# Patient Record
Sex: Female | Born: 1940 | Race: White | Hispanic: No | Smoking: Never smoker
Health system: Southern US, Community
[De-identification: ages and names within clinical notes are randomized; demographics above are authoritative.]

## PROBLEM LIST (undated history)

## (undated) DIAGNOSIS — I10 Essential (primary) hypertension: Secondary | ICD-10-CM

## (undated) DIAGNOSIS — K219 Gastro-esophageal reflux disease without esophagitis: Secondary | ICD-10-CM

## (undated) DIAGNOSIS — M810 Age-related osteoporosis without current pathological fracture: Secondary | ICD-10-CM

## (undated) HISTORY — PX: CARPAL TUNNEL RELEASE: SHX101

---

## 2019-05-18 ENCOUNTER — Emergency Department (HOSPITAL_BASED_OUTPATIENT_CLINIC_OR_DEPARTMENT_OTHER)
Admission: EM | Admit: 2019-05-18 | Discharge: 2019-05-18 | Disposition: A | Discharge status: Home / Self Care | Payer: Medicare Other | Attending: Emergency Medicine | Admitting: Emergency Medicine

## 2019-05-18 ENCOUNTER — Encounter (HOSPITAL_BASED_OUTPATIENT_CLINIC_OR_DEPARTMENT_OTHER): Payer: Self-pay

## 2019-05-18 ENCOUNTER — Other Ambulatory Visit: Payer: Self-pay

## 2019-05-18 ENCOUNTER — Emergency Department (HOSPITAL_BASED_OUTPATIENT_CLINIC_OR_DEPARTMENT_OTHER): Payer: Medicare Other

## 2019-05-18 DIAGNOSIS — I10 Essential (primary) hypertension: Secondary | ICD-10-CM | POA: Diagnosis not present

## 2019-05-18 DIAGNOSIS — U071 COVID-19: Secondary | ICD-10-CM | POA: Diagnosis present

## 2019-05-18 HISTORY — DX: Age-related osteoporosis without current pathological fracture: M81.0

## 2019-05-18 HISTORY — DX: Essential (primary) hypertension: I10

## 2019-05-18 HISTORY — DX: Gastro-esophageal reflux disease without esophagitis: K21.9

## 2019-05-18 LAB — CBC WITH DIFFERENTIAL/PLATELET
Abs Immature Granulocytes: 0.01 10*3/uL (ref 0.00–0.07)
Basophils Absolute: 0 10*3/uL (ref 0.0–0.1)
Basophils Relative: 0 %
Eosinophils Absolute: 0 10*3/uL (ref 0.0–0.5)
Eosinophils Relative: 1 %
HCT: 36.1 % (ref 36.0–46.0)
Hemoglobin: 11.8 g/dL — ABNORMAL LOW (ref 12.0–15.0)
Immature Granulocytes: 0 %
Lymphocytes Relative: 39 %
Lymphs Abs: 1.7 10*3/uL (ref 0.7–4.0)
MCH: 30.5 pg (ref 26.0–34.0)
MCHC: 32.7 g/dL (ref 30.0–36.0)
MCV: 93.3 fL (ref 80.0–100.0)
Monocytes Absolute: 0.5 10*3/uL (ref 0.1–1.0)
Monocytes Relative: 11 %
Neutro Abs: 2.1 10*3/uL (ref 1.7–7.7)
Neutrophils Relative %: 49 %
Platelets: 318 10*3/uL (ref 150–400)
RBC: 3.87 MIL/uL (ref 3.87–5.11)
RDW: 13.5 % (ref 11.5–15.5)
WBC: 4.2 10*3/uL (ref 4.0–10.5)
nRBC: 0 % (ref 0.0–0.2)

## 2019-05-18 LAB — COMPREHENSIVE METABOLIC PANEL
ALT: 13 U/L (ref 0–44)
AST: 22 U/L (ref 15–41)
Albumin: 4.1 g/dL (ref 3.5–5.0)
Alkaline Phosphatase: 49 U/L (ref 38–126)
Anion gap: 7 (ref 5–15)
BUN: 13 mg/dL (ref 8–23)
CO2: 24 mmol/L (ref 22–32)
Calcium: 8.8 mg/dL — ABNORMAL LOW (ref 8.9–10.3)
Chloride: 99 mmol/L (ref 98–111)
Creatinine, Ser: 0.64 mg/dL (ref 0.44–1.00)
GFR calc Af Amer: >60 mL/min (ref >60)
GFR calc non Af Amer: >60 mL/min (ref >60)
Glucose, Bld: 134 mg/dL — ABNORMAL HIGH (ref 70–99)
Potassium: 3.9 mmol/L (ref 3.5–5.1)
Sodium: 130 mmol/L — ABNORMAL LOW (ref 135–145)
Total Bilirubin: 0.3 mg/dL (ref 0.3–1.2)
Total Protein: 7.5 g/dL (ref 6.5–8.1)

## 2019-05-18 LAB — TROPONIN I (HIGH SENSITIVITY): Troponin I (High Sensitivity): 3 ng/L (ref <18)

## 2019-05-18 NOTE — Discharge Instructions (Addendum)
Your symptoms are consistent with known COVID 19 infection.  Your oxygen levels are normal in the Emergency Department, your chest xray and heart numbers are normal, and your other labs do not show any other significant abnormalities.  Continue to quarantine, rest, stay hydrated and monitor your symptoms.  If you have worsening shortness of breath, fatigue or other concerns, return to the ED.

## 2019-05-18 NOTE — ED Notes (Addendum)
Ambulated in  room, no sob. Pulse ox stayed 97-98% HR in the 80-'s. Denies chest pain or sob.

## 2019-05-18 NOTE — ED Triage Notes (Signed)
Pt c/o chest tightness x 2 days and fatigue x 4 days-pt with +covid test yesterday-NAD-steady gait

## 2019-05-18 NOTE — ED Provider Notes (Signed)
[facility] EMERGENCY DEPARTMENT Provider Note   CSN: [id number] Arrival date & time: 05/18/19  1500     History Chief Complaint  Patient presents with  . Chest Pain  . +Covid    Alicia Sherman is a 78 y.o. female.  HPI      Monday felt sick, Tuesday felt better but began to feel ill again Tested positive for COVID yesterday Brother tested positive Last night began to have nausea, chest tightness, severe fatigue, some shortness of breath with walking. Just feeling very weak.  Chest tightness comes and goes.  Head seemed more congested last night, better this afternoon after decongestant and tylenol.  Monday had fever, but has been taking tylenol. Monday was 101.5.  Mild cough. No sore throat. Some loose stool but not unusual.   No leg pain or swelling.  Headache mild.  Low appetite, but has been making self eat and stay hydrated.   Past Medical History:  Diagnosis Date  . GERD (gastroesophageal reflux disease)   . Hypertension   . Osteoporosis     There are no problems to display for this patient.   Past Surgical History:  Procedure Laterality Date  . CARPAL TUNNEL RELEASE       OB History   No obstetric history on file.     No family history on file.  Social History   Tobacco Use  . Smoking status: Never Smoker  . Smokeless tobacco: Never Used  Substance Use Topics  . Alcohol use: Never  . Drug use: Never    Home Medications Prior to Admission medications   Not on File    Allergies    Biaxin [clarithromycin] and Codeine  Review of Systems   Review of Systems  Constitutional: Positive for appetite change and fatigue. Negative for fever (had monday now resolved).  HENT: Positive for congestion.   Respiratory: Positive for cough, chest tightness and shortness of breath.   Gastrointestinal: Positive for diarrhea and nausea. Negative for abdominal pain and vomiting.  Musculoskeletal: Negative for back pain.  Skin: Negative for rash.    Neurological: Positive for headaches.    Physical Exam Updated Vital Signs BP (!) 160/82 (BP Location: Right Arm)   Pulse 70   Temp 98.2 F (36.8 C) (Oral)   Resp 18   Ht 5\' 2"  (1.575 m)   Wt 57.6 kg   SpO2 99%   BMI 23.23 kg/m   Physical Exam Vitals and nursing note reviewed.  Constitutional:      General: She is not in acute distress.    Appearance: She is well-developed. She is not diaphoretic.  HENT:     Head: Normocephalic and atraumatic.  Eyes:     Conjunctiva/sclera: Conjunctivae normal.  Cardiovascular:     Rate and Rhythm: Normal rate and regular rhythm.     Heart sounds: Normal heart sounds. No murmur. No friction rub. No gallop.   Pulmonary:     Effort: Pulmonary effort is normal. No respiratory distress.     Breath sounds: Normal breath sounds. No wheezing or rales.  Abdominal:     General: There is no distension.     Palpations: Abdomen is soft.     Tenderness: There is no abdominal tenderness. There is no guarding.  Musculoskeletal:        General: No tenderness.     Cervical back: Normal range of motion.  Skin:    General: Skin is warm and dry.     Findings: No erythema or  rash.  Neurological:     Mental Status: She is alert and oriented to person, place, and time.     ED Results / Procedures / Treatments   Labs (all labs ordered are listed, but only abnormal results are displayed) Labs Reviewed  CBC WITH DIFFERENTIAL/PLATELET - Abnormal; Notable for the following components:      Result Value   Hemoglobin 11.8 (*)    All other components within normal limits  COMPREHENSIVE METABOLIC PANEL - Abnormal; Notable for the following components:   Sodium 130 (*)    Glucose, Bld 134 (*)    Calcium 8.8 (*)    All other components within normal limits  TROPONIN I (HIGH SENSITIVITY)  TROPONIN I (HIGH SENSITIVITY)    EKG EKG Interpretation  Date/Time:  Thursday May 18 2019 15:12:35 EST Ventricular Rate:  70 PR Interval:  172 QRS  Duration: 76 QT Interval:  394 QTC Calculation: 425 R Axis:   78 Text Interpretation: Normal sinus rhythm Anterior infarct , age undetermined Abnormal ECG No previous ECGs available Confirmed by Alvira Monday ([id number]) on 05/18/2019 3:23:47 PM   Radiology DG Chest Portable 1 View  Result Date: 05/18/2019 CLINICAL DATA:  Chest pain and tightness for several days, history of COVID-19 positivity EXAM: PORTABLE CHEST 1 VIEW COMPARISON:  None. FINDINGS: The heart size and mediastinal contours are within normal limits. Both lungs are clear. The visualized skeletal structures are unremarkable. IMPRESSION: No active disease. Electronically Signed   By: Alcide Clever M.D.   On: 05/18/2019 15:52    Procedures Procedures (including critical care time)  Medications Ordered in ED Medications - No data to display  ED Course  I have reviewed the triage vital signs and the nursing notes.  Pertinent labs & imaging results that were available during my care of the patient were reviewed by me and considered in my medical decision making (see chart for details).    MDM Rules/Calculators/A&P                      78yo female with history of hypertension and recent positive COVID 19 testing presents with concern for nausea, severe fatigue, chest tightness, nausea, generalized weakness.  EKG without acute findings. Troponin negative after continuing symptoms and doubt ACS. Labs without other significant acute changes.  Chest XR without pneumonia, edema or pneumothorax.  Suspect symptoms secondary to COVID 19 infection. Do not appreciate asymmetric leg swelling, low suspicion for PE at this time.  Normal oxygen saturations at rest and also with ambulation.  Discussed recommendation for continued supportive care, monitoring of symptoms.  Patient discharged in stable condition with understanding of reasons to return.    Final Clinical Impression(s) / ED Diagnoses Final diagnoses:  COVID-19    Rx / DC  Orders ED Discharge Orders    None       Alvira Monday, MD 05/19/19 1319

## 2021-03-08 IMAGING — DX DG CHEST 1V PORT
1 series · 1 of 1 positions shown · non-contrast
Comparison: None.

CLINICAL DATA: Chest pain and tightness for several days, history
of [id number] positivity

EXAM:
PORTABLE CHEST 1 VIEW

[chest ap]
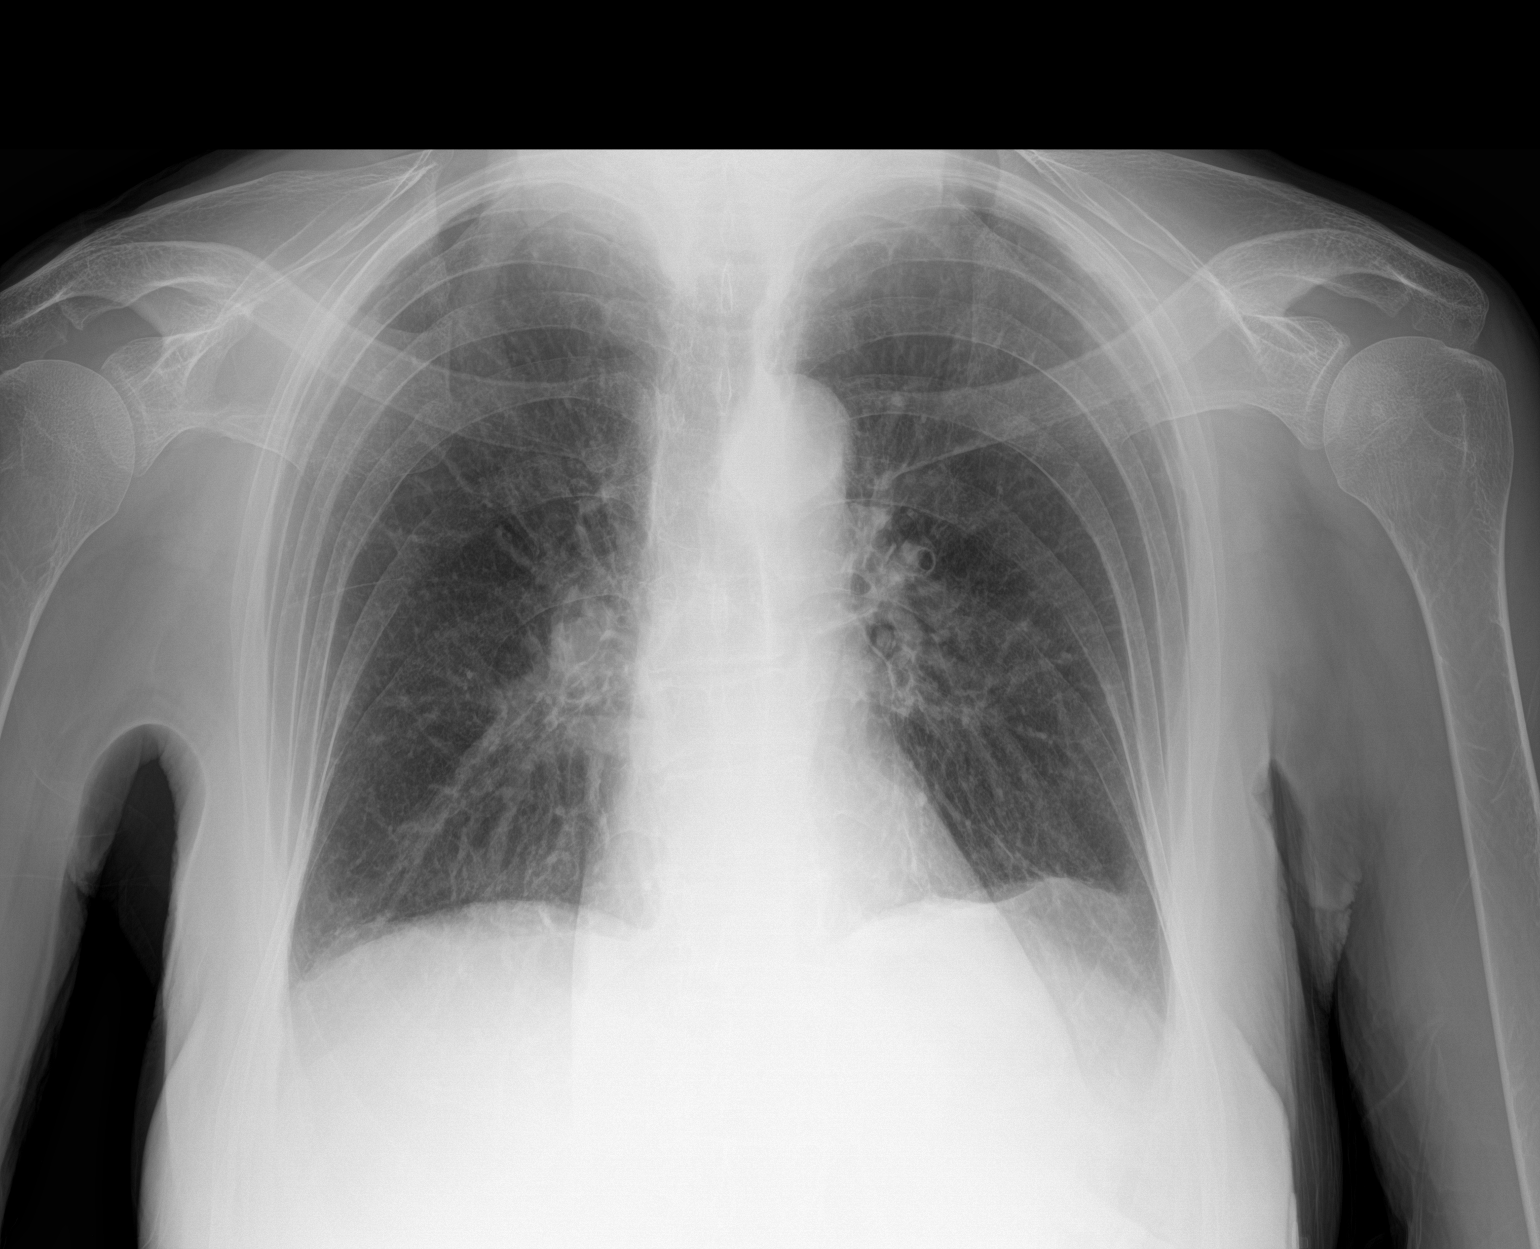

[1 of 1 positions shown; findings below may reference images not displayed]

FINDINGS: The heart size and mediastinal contours are within normal limits.
Both lungs are clear. The visualized skeletal structures are
unremarkable.
IMPRESSION: No active disease.

## 2022-06-10 ENCOUNTER — Ambulatory Visit (INDEPENDENT_AMBULATORY_CARE_PROVIDER_SITE_OTHER): Payer: Medicare Other | Admitting: Neurology

## 2022-06-10 ENCOUNTER — Encounter: Payer: Self-pay | Admitting: Neurology

## 2022-06-10 VITALS — BP 153/84 | HR 67 | Ht 62.0 in | Wt 128.5 lb

## 2022-06-10 DIAGNOSIS — G3184 Mild cognitive impairment, so stated: Secondary | ICD-10-CM

## 2022-06-10 DIAGNOSIS — G466 Pure sensory lacunar syndrome: Secondary | ICD-10-CM | POA: Diagnosis not present

## 2022-06-10 DIAGNOSIS — G459 Transient cerebral ischemic attack, unspecified: Secondary | ICD-10-CM

## 2022-06-10 DIAGNOSIS — R202 Paresthesia of skin: Secondary | ICD-10-CM | POA: Diagnosis not present

## 2022-06-10 MED ORDER — CLOPIDOGREL BISULFATE 75 MG PO TABS: 75.0000 mg | ORAL_TABLET | Freq: Every day | ORAL | 0 refills | Status: AC

## 2022-06-10 NOTE — Patient Instructions (Signed)
I had a long d/w patient about her  recent episodes of cheiro oral paresthesias, risk for recurrent stroke/TIAs, personally independently reviewed imaging studies and stroke evaluation results and answered questions.I recommend she add Plavix 75 mg daily for 3 weeks continue aspirin 81 mg daily long-term   for secondary stroke prevention and maintain strict control of hypertension with blood pressure goal below 130/90, diabetes with hemoglobin A1c goal below 6.5% and lipids with LDL cholesterol goal below 70 mg/dL. I also advised the patient to eat a healthy diet with plenty of whole grains, cereals, fruits and vegetables, exercise regularly and maintain ideal body weight .We discussed memory compensation strategies for mild cognitive impairment.  Followup in the future with me in 3 months or call earlier if necessary  Memory Compensation Strategies  Use "WARM" strategy.  W= write it down  A= associate it  R= repeat it  M= make a mental note  2.   You can keep a Glass blower/designer.  Use a 3-ring notebook with sections for the following: calendar, important names and phone numbers,  medications, doctors' names/phone numbers, lists/reminders, and a section to journal what you did  each day.   3.    Use a calendar to write appointments down.  4.    Write yourself a schedule for the day.  This can be placed on the calendar or in a separate section of the Memory Notebook.  Keeping a  regular schedule can help memory.  5.    Use medication organizer with sections for each day or morning/evening pills.  You may need help loading it  6.    Keep a basket, or pegboard by the door.  Place items that you need to take out with you in the basket or on the pegboard.  You may also want to  include a message board for reminders.  7.    Use sticky notes.  Place sticky notes with reminders in a place where the task is performed.  For example: " turn off the  stove" placed by the stove, "lock the door" placed on  the door at eye level, " take your medications" on  the bathroom mirror or by the place where you normally take your medications.  8.    Use alarms/timers.  Use while cooking to remind yourself to check on food or as a reminder to take your medicine, or as a  reminder to make a call, or as a reminder to perform another task, etc.  Stroke Prevention Some medical conditions and behaviors can lead to a higher chance of having a stroke. You can help prevent a stroke by eating healthy, exercising, not smoking, and managing any medical conditions you have. Stroke is a leading cause of functional impairment. Primary prevention is particularly important because a majority of strokes are first-time events. Stroke changes the lives of not only those who experience a stroke but also their family and other caregivers. How can this condition affect me? A stroke is a medical emergency and should be treated right away. A stroke can lead to brain damage and can sometimes be life-threatening. If a person gets medical treatment right away, there is a better chance of surviving and recovering from a stroke. What can increase my risk? The following medical conditions may increase your risk of a stroke: Cardiovascular disease. High blood pressure (hypertension). Diabetes. High cholesterol. Sickle cell disease. Blood clotting disorders (hypercoagulable state). Obesity. Sleep disorders (obstructive sleep apnea). Other risk factors include: Being older  than age 56. Having a history of blood clots, stroke, or mini-stroke (transient ischemic attack, TIA). Genetic factors, such as race, ethnicity, or a family history of stroke. Smoking cigarettes or using other tobacco products. Taking birth control pills, especially if you also use tobacco. Heavy use of alcohol or drugs, especially cocaine and methamphetamine. Physical inactivity. What actions can I take to prevent this? Manage your health conditions High  cholesterol levels. Eating a healthy diet is important for preventing high cholesterol. If cholesterol cannot be managed through diet alone, you may need to take medicines. Take any prescribed medicines to control your cholesterol as told by your health care provider. Hypertension. To reduce your risk of stroke, try to keep your blood pressure below 130/80. Eating a healthy diet and exercising regularly are important for controlling blood pressure. If these steps are not enough to manage your blood pressure, you may need to take medicines. Take any prescribed medicines to control hypertension as told by your health care provider. Ask your health care provider if you should monitor your blood pressure at home. Have your blood pressure checked every year, even if your blood pressure is normal. Blood pressure increases with age and some medical conditions. Diabetes. Eating a healthy diet and exercising regularly are important parts of managing your blood sugar (glucose). If your blood sugar cannot be managed through diet and exercise, you may need to take medicines. Take any prescribed medicines to control your diabetes as told by your health care provider. Get evaluated for obstructive sleep apnea. Talk to your health care provider about getting a sleep evaluation if you snore a lot or have excessive sleepiness. Make sure that any other medical conditions you have, such as atrial fibrillation or atherosclerosis, are managed. Nutrition Follow instructions from your health care provider about what to eat or drink to help manage your health condition. These instructions may include: Reducing your daily calorie intake. Limiting how much salt (sodium) you use to 1,500 milligrams (mg) each day. Using only healthy fats for cooking, such as olive oil, canola oil, or sunflower oil. Eating healthy foods. You can do this by: Choosing foods that are high in fiber, such as whole grains, and fresh fruits and  vegetables. Eating at least 5 servings of fruits and vegetables a day. Try to fill one-half of your plate with fruits and vegetables at each meal. Choosing lean protein foods, such as lean cuts of meat, poultry without skin, fish, tofu, beans, and nuts. Eating low-fat dairy products. Avoiding foods that are high in sodium. This can help lower blood pressure. Avoiding foods that have saturated fat, trans fat, and cholesterol. This can help prevent high cholesterol. Avoiding processed and prepared foods. Counting your daily carbohydrate intake.  Lifestyle If you drink alcohol: Limit how much you have to: 0-1 drink a day for women who are not pregnant. 0-2 drinks a day for men. Know how much alcohol is in your drink. In the [place] one drink equals one 12 oz bottle of beer (375mL), one 5 oz glass of wine (152mL), or one 1 oz glass of hard liquor (23mL). Do not use any products that contain nicotine or tobacco. These products include cigarettes, chewing tobacco, and vaping devices, such as e-cigarettes. If you need help quitting, ask your health care provider. Avoid secondhand smoke. Do not use drugs. Activity  Try to stay at a healthy weight. Get at least 30 minutes of exercise on most days, such as: Fast walking. Biking. Swimming. Medicines Take over-the-counter  and prescription medicines only as told by your health care provider. Aspirin or blood thinners (antiplatelets or anticoagulants) may be recommended to reduce your risk of forming blood clots that can lead to stroke. Avoid taking birth control pills. Talk to your health care provider about the risks of taking birth control pills if: You are over 69 years old. You smoke. You get very bad headaches. You have had a blood clot. Where to find more information American Stroke Association: www.strokeassociation.org Get help right away if: You or a loved one has any symptoms of a stroke. "BE FAST" is an easy way to remember the main  warning signs of a stroke: B - Balance. Signs are dizziness, sudden trouble walking, or loss of balance. E - Eyes. Signs are trouble seeing or a sudden change in vision. F - Face. Signs are sudden weakness or numbness of the face, or the face or eyelid drooping on one side. A - Arms. Signs are weakness or numbness in an arm. This happens suddenly and usually on one side of the body. S - Speech. Signs are sudden trouble speaking, slurred speech, or trouble understanding what people say. T - Time. Time to call emergency services. Write down what time symptoms started. You or a loved one has other signs of a stroke, such as: A sudden, severe headache with no known cause. Nausea or vomiting. Seizure. These symptoms may represent a serious problem that is an emergency. Do not wait to see if the symptoms will go away. Get medical help right away. Call your local emergency services (911 in the [place] Do not drive yourself to the hospital. Summary You can help to prevent a stroke by eating healthy, exercising, not smoking, limiting alcohol intake, and managing any medical conditions you may have. Do not use any products that contain nicotine or tobacco. These include cigarettes, chewing tobacco, and vaping devices, such as e-cigarettes. If you need help quitting, ask your health care provider. Remember "BE FAST" for warning signs of a stroke. Get help right away if you or a loved one has any of these signs. This information is not intended to replace advice given to you by your health care provider. Make sure you discuss any questions you have with your health care provider. Document Revised: 12/04/2019 Document Reviewed: 12/04/2019 Elsevier Patient Education  [place].

## 2022-06-10 NOTE — Progress Notes (Signed)
[facility] [address] [place]. [place] [address] [phone]       OFFICE CONSULT NOTE  [name] Date of Birth:  08-Mar-1941 Medical Record Number:  [id number]   Referring MD:  Teressa Lower  Reason for Referral:  TIA  HPI: Alicia Sherman is a pleasant 82 year old Caucasian lady seen today for initial office consultation visit.  She is accompanied today by her niece Elizebeth Brooking, NP.  History is obtained from them and review of electronic medical records and care everywhere.  No actual imaging films are available for review but reports were available.  He has past medical history of hypertension, osteoporosis and gastroesophageal reflux disease.  He was recently admitted to [facility] on 05/31/2022 for sudden onset of left face and and feet paresthesias which she describes as a sensation of tingling and lasted for several hours and improved on this the next day.  She denies any accompanying slurred speech, extremity weakness, gait or balance problems. Her blood pressure was found to be elevated with systolics in the 741U.  By the time EMS arrived with came down somewhat to 160s.  He was taken to the hospital where she was hospitalized.  CT head was unremarkable.  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  and changes of chronic small vessel disease.  CT angiogram of the brain and neck showed no large vessel stenosis but showed only minor abnormalities.  Transthoracic echo showed ejection fraction of 60 to 65%.  Left atrial size is normal.  LDL cholesterol was 97 mg percent and hemoglobin A1c 5.8.  Serum sodium was 124 but she has known chronic hyponatremia.  Patient states she had somewhat similar episode in September 2023 and she was seen briefly in [facility] emergency room at that time.  He was told that her blood pressure was elevated and that was responsible for her symptoms.  Since discharge patient states about a  week or 2 ago she also had a milder episode of tingling on the left side.  She has been started on aspirin 81 mg daily which she is taking and tolerating well without side effects.  Her blood pressure is doing better though today it is slightly elevated in office at 153/83.  ROS:   14 system review of systems is positive for tingling, numbness and all other systems negative  PMH:  Past Medical History:  Diagnosis Date   GERD (gastroesophageal reflux disease)    Hypertension    Osteoporosis     Social History:  Social History   Socioeconomic History   Marital status: Widowed    Spouse name: Not on file   Number of children: Not on file   Years of education: Not on file   Highest education level: Not on file  Occupational History   Not on file  Tobacco Use   Smoking status: Never   Smokeless tobacco: Never  Vaping Use   Vaping Use: Never used  Substance and Sexual Activity   Alcohol use: Never   Drug use: Never   Sexual activity: Not on file  Other Topics Concern   Not on file  Social History Narrative   Not on file   Social Determinants of Health   Financial Resource Strain: Not on file  Food Insecurity: Not on file  Transportation Needs: Not on file  Physical Activity: Not on file  Stress: Not on file  Social Connections: Not on file  Intimate Partner Violence:  Not on file    Medications:   Current Outpatient Medications on File Prior to Visit  Medication Sig Dispense Refill   aspirin EC 81 MG tablet Take 81 mg by mouth daily.     atorvastatin (LIPITOR) 40 MG tablet Take 40 mg by mouth daily.     carvedilol (COREG) 6.25 MG tablet Take 6.25 mg by mouth 2 (two) times daily with a meal.     valsartan (DIOVAN) 320 MG tablet Take 160 mg by mouth daily.     Calcium Carb-Cholecalciferol 600-12.5 MG-MCG CAPS Take 1 capsule by mouth 2 (two) times daily.     Probiotic Product (ADVANCED PROBIOTIC 10) CAPS Take 1 capsule by mouth daily.     No current  facility-administered medications on file prior to visit.    Allergies:   Allergies  Allergen Reactions   Biaxin [Clarithromycin] Nausea And Vomiting   Codeine Nausea And Vomiting    Physical Exam General: well developed, well nourished pleasant elderly Caucasian lady, seated, in no evident distress Head: head normocephalic and atraumatic.   Neck: supple with no carotid or supraclavicular bruits Cardiovascular: regular rate and rhythm, no murmurs Musculoskeletal: no deformity Skin:  no rash/petichiae Vascular:  Normal pulses all extremities  Neurologic Exam Mental Status: Awake and fully alert. Oriented to place and time. Recent and remote memory intact. Attention span, concentration and fund of knowledge appropriate. Mood and affect appropriate.  Diminished recall 1/3.  Able to name 12 animals which can walk on 4 legs. Cranial Nerves: Fundoscopic exam reveals sharp disc margins. Pupils equal, briskly reactive to light. Extraocular movements full without nystagmus. Visual fields full to confrontation. Hearing intact. Facial sensation intact. Face, tongue, palate moves normally and symmetrically.  Motor: Normal bulk and tone. Normal strength in all tested extremity muscles. Sensory.: intact to touch , pinprick , position and vibratory sensation.  Coordination: Rapid alternating movements normal in all extremities. Finger-to-nose and heel-to-shin performed accurately bilaterally. Gait and Station: Arises from chair without difficulty. Stance is normal. Gait demonstrates normal stride length and balance . Able to heel, toe and tandem walk with moderate difficulty.  Reflexes: 1+ and symmetric. Toes downgoing.   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  Vascular risk factors of hypertension and hyperlipidemia and age only.  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     PLAN: I had a long d/w patient about her  recent episodes of cheiro oral paresthesias, risk for recurrent stroke/TIAs, personally independently reviewed imaging studies and stroke evaluation results and answered questions.I recommend she add Plavix 75 mg daily for 3 weeks continue aspirin 81 mg daily long-term   for secondary stroke prevention and maintain strict control of hypertension with blood pressure goal below 130/90, diabetes with hemoglobin A1c goal below 6.5% and lipids with LDL cholesterol goal below 70 mg/dL. I also advised the patient to eat a healthy diet with plenty of whole grains, cereals, fruits and vegetables, exercise regularly and maintain ideal body weight .We discussed memory compensation strategies for mild cognitive impairment.  Followup in the future with me in 3 months or call earlier if necessary.  Greater than 50% time during this 45-minute consultation visit were spent on counseling and coordination of care about episodes of paresthesias and TIA and answering questions.  Antony Contras, MD Note: This document was prepared with digital dictation  and possible smart Company secretary. Any transcriptional errors that result from this process are unintentional.

## 2022-09-15 ENCOUNTER — Ambulatory Visit (INDEPENDENT_AMBULATORY_CARE_PROVIDER_SITE_OTHER): Payer: Medicare Other | Admitting: Neurology

## 2022-09-15 ENCOUNTER — Encounter: Payer: Self-pay | Admitting: Neurology

## 2022-09-15 VITALS — BP 165/89 | HR 61 | Ht 63.0 in | Wt 127.0 lb

## 2022-09-15 DIAGNOSIS — G3184 Mild cognitive impairment, so stated: Secondary | ICD-10-CM

## 2022-09-15 DIAGNOSIS — Z8673 Personal history of transient ischemic attack (TIA), and cerebral infarction without residual deficits: Secondary | ICD-10-CM | POA: Diagnosis not present

## 2022-09-15 NOTE — Patient Instructions (Addendum)
I had a long d/w patient and her daughter about her  recent episodes of TIA, risk for recurrent stroke/TIAs, personally independently reviewed imaging studies and stroke evaluation results and answered questions.I recommend she  continue aspirin 81 mg daily long-term   for secondary stroke prevention and maintain strict control of hypertension with blood pressure goal below 130/90, diabetes with hemoglobin A1c goal below 6.5% and lipids with LDL cholesterol goal below 70 mg/dL. I also advised the patient to eat a healthy diet with plenty of whole grains, cereals, fruits and vegetables, exercise regularly and maintain ideal body weight .We discussed memory compensation strategies for mild cognitive impairment and I advised her to increase participation in cognitively challenging activities like solving crossword puzzles, playing bridge, word searches and duple..  Followup in the future with me in 1 year or call earlier if necessary.    Memory Compensation Strategies  Use "WARM" strategy.  W= write it down  A= associate it  R= repeat it  M= make a mental note  2.   You can keep a Glass blower/designer.  Use a 3-ring notebook with sections for the following: calendar, important names and phone numbers,  medications, doctors' names/phone numbers, lists/reminders, and a section to journal what you did  each day.   3.    Use a calendar to write appointments down.  4.    Write yourself a schedule for the day.  This can be placed on the calendar or in a separate section of the Memory Notebook.  Keeping a  regular schedule can help memory.  5.    Use medication organizer with sections for each day or morning/evening pills.  You may need help loading it  6.    Keep a basket, or pegboard by the door.  Place items that you need to take out with you in the basket or on the pegboard.  You may also want to  include a message board for reminders.  7.    Use sticky notes.  Place sticky notes with reminders in a  place where the task is performed.  For example: " turn off the  stove" placed by the stove, "lock the door" placed on the door at eye level, " take your medications" on  the bathroom mirror or by the place where you normally take your medications.  8.    Use alarms/timers.  Use while cooking to remind yourself to check on food or as a reminder to take your medicine, or as a  reminder to make a call, or as a reminder to perform another task, etc.

## 2022-09-15 NOTE — Progress Notes (Signed)
[facility] [address] [place] [place] [address] [phone]       OFFICE follow-up visit T NOTE  [name] Date of Birth:  1941/02/17 Medical Record Number:  [id number]   Referring MD:  Alicia Sherman  Reason for Referral:  TIA  HPI: Initial visit 06/06/2022 Alicia Sherman is a pleasant 82 year old Caucasian lady seen today for initial office consultation visit.  She is accompanied today by her niece Alicia Cooper, NP.  History is obtained from them and review of electronic medical records and care everywhere.  No actual imaging films are available for review but reports were available.  He has past medical history of hypertension, osteoporosis and gastroesophageal reflux disease.  He was recently admitted to [facility] on 05/31/2022 for sudden onset of left face and and feet paresthesias which she describes as a sensation of tingling and lasted for several hours and improved on this the next day.  She denies any accompanying slurred speech, extremity weakness, gait or balance problems. Her blood pressure was found to be elevated with systolics in the 190s.  By the time EMS arrived with came down somewhat to 160s.  He was taken to the hospital where she was hospitalized.  CT head was unremarkable.  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  and changes of chronic small vessel disease.  CT angiogram of the brain and neck showed no large vessel stenosis but showed only minor abnormalities.  Transthoracic echo showed ejection fraction of 60 to 65%.  Left atrial size is normal.  LDL cholesterol was 97 mg percent and hemoglobin A1c 5.8.  Serum sodium was 124 but she has known chronic hyponatremia.  Patient states she had somewhat similar episode in September 2023 and she was seen briefly in [facility] emergency room at that time.  He was told that her blood pressure was elevated and that was responsible for her symptoms.  Since  discharge patient states about a week or 2 ago she also had a milder episode of tingling on the left side.  She has been started on aspirin 81 mg daily which she is taking and tolerating well without side effects.  Her blood pressure is doing better though today it is slightly elevated in office at 153/83. Update 09/15/2022 : She returns for follow-up after last visit 3 months ago.  She is doing well.  She has had no further episodes of paresthesias or any other stroke or TIA-like symptoms.  He no longer notes any dizziness or any sensation in the left leg.  She remains on aspirin which is tolerating well without bruising or bleeding.  She states her blood pressure is well-controlled at home and she brought her blood pressure log which shows average systolic ranging from 110-130s.  Today her blood pressure is slightly elevated in the office at 160/80.  She is tolerating Lipitor 40 mg well without muscle aches and pains.  Last lipid profile on 08/31/2022 showed total cholesterol 121, LDL 53, HDL 55 and triglycerides 54 mg percent.  Hemoglobin A1c on 07/02/2022 was optimal at 5.8.  He continues to have mild short-term memory difficulties but she feels these are stable and nonprogressive.  He is still quite independent with all activities of daily living.  He has however not been doing regular activities challenging.  He is still quite physically active.  She has no new complaints. ROS:   14 system review of systems is positive for tingling, numbness  and all other systems negative  PMH:  Past Medical History:  Diagnosis Date   GERD (gastroesophageal reflux disease)    Hypertension    Osteoporosis     Social History:  Social History   Socioeconomic History   Marital status: Widowed    Spouse name: Not on file   Number of children: Not on file   Years of education: Not on file   Highest education level: Not on file  Occupational History   Not on file  Tobacco Use   Smoking status: Never   Smokeless  tobacco: Never  Vaping Use   Vaping Use: Never used  Substance and Sexual Activity   Alcohol use: Never   Drug use: Never   Sexual activity: Not on file  Other Topics Concern   Not on file  Social History Narrative   Not on file   Social Determinants of Health   Financial Resource Strain: Not on file  Food Insecurity: Not on file  Transportation Needs: Not on file  Physical Activity: Not on file  Stress: Not on file  Social Connections: Not on file  Intimate Partner Violence: Not on file    Medications:   Current Outpatient Medications on File Prior to Visit  Medication Sig Dispense Refill   ASPIRIN 81 PO Take by mouth daily.     atorvastatin (LIPITOR) 40 MG tablet Take 40 mg by mouth daily.     Calcium Carb-Cholecalciferol 600-12.5 MG-MCG CAPS Take 1 capsule by mouth 2 (two) times daily.     carvedilol (COREG) 6.25 MG tablet Take 6.25 mg by mouth 2 (two) times daily with a meal.     Probiotic Product (ADVANCED PROBIOTIC 10) CAPS Take 1 capsule by mouth daily.     valsartan (DIOVAN) 320 MG tablet Take 160 mg by mouth daily.     No current facility-administered medications on file prior to visit.    Allergies:   Allergies  Allergen Reactions   Biaxin [Clarithromycin] Nausea And Vomiting   Codeine Nausea And Vomiting    Physical Exam General: well developed, well nourished pleasant elderly Caucasian lady, seated, in no evident distress Head: head normocephalic and atraumatic.   Neck: supple with no carotid or supraclavicular bruits Cardiovascular: regular rate and rhythm, no murmurs Musculoskeletal: no deformity Skin:  no rash/petichiae Vascular:  Normal pulses all extremities  Neurologic Exam Mental Status: Awake and fully alert. Oriented to place and time. Recent and remote memory intact. Attention span, concentration and fund of knowledge appropriate. Mood and affect appropriate.  Recall 3/3.  Able to name 13 animals which can walk on 4 legs.  Clock drawing  4/4 Cranial Nerves: Fundoscopic exam not done. Pupils equal, briskly reactive to light. Extraocular movements full without nystagmus. Visual fields full to confrontation. Hearing intact. Facial sensation intact. Face, tongue, palate moves normally and symmetrically.  Motor: Normal bulk and tone. Normal strength in all tested extremity muscles. Sensory.: intact to touch , pinprick , position and vibratory sensation.  Coordination: Rapid alternating movements normal in all extremities. Finger-to-nose and heel-to-shin performed accurately bilaterally. Gait and Station: Arises from chair without difficulty. Stance is normal. Gait demonstrates normal stride length and balance . Able to heel, toe and tandem walk with moderate difficulty.  Reflexes: 1+ and symmetric. Toes downgoing.      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  Vascular risk factors of hypertension and hyperlipidemia and age  only.  She has mild age-appropriate cognitive impairment and balance difficulties which appears stable     PLAN: I had a long d/w patient and her daughter about her  recent episodes of TIA, risk for recurrent stroke/TIAs, personally independently reviewed imaging studies and stroke evaluation results and answered questions.I recommend she  continue aspirin 81 mg daily long-term   for secondary stroke prevention and maintain strict control of hypertension with blood pressure goal below 130/90, diabetes with hemoglobin A1c goal below 6.5% and lipids with LDL cholesterol goal below 70 mg/dL. I also advised the patient to eat a healthy diet with plenty of whole grains, cereals, fruits and vegetables, exercise regularly and maintain ideal body weight .We discussed memory compensation strategies for mild cognitive impairment and I advised her to increase participation in cognitively challenging  activities like solving crossword puzzles, playing bridge, word searches and duple..  Followup in the future with me in 1 year or call earlier if necessary.  Greater than 50% time during this 35-minute   visit were spent on counseling and coordination of care about episodes of paresthesias and TIA and answering questions.  Delia Heady, MD Note: This document was prepared with digital dictation and possible smart phrase technology. Any transcriptional errors that result from this process are unintentional.

## 2023-02-15 ENCOUNTER — Telehealth: Payer: Self-pay

## 2023-02-15 ENCOUNTER — Emergency Department (HOSPITAL_COMMUNITY): Payer: Medicare Other

## 2023-02-15 ENCOUNTER — Encounter: Payer: Self-pay | Admitting: Neurology

## 2023-02-15 ENCOUNTER — Other Ambulatory Visit: Payer: Self-pay

## 2023-02-15 ENCOUNTER — Encounter (HOSPITAL_COMMUNITY): Payer: Self-pay

## 2023-02-15 ENCOUNTER — Emergency Department (HOSPITAL_COMMUNITY)
Admission: EM | Admit: 2023-02-15 | Discharge: 2023-02-16 | Disposition: A | Discharge status: Home / Self Care | Payer: Medicare Other | Attending: Emergency Medicine | Admitting: Emergency Medicine

## 2023-02-15 DIAGNOSIS — R200 Anesthesia of skin: Secondary | ICD-10-CM | POA: Diagnosis not present

## 2023-02-15 DIAGNOSIS — I1A0 Resistant hypertension: Secondary | ICD-10-CM | POA: Diagnosis not present

## 2023-02-15 DIAGNOSIS — I6782 Cerebral ischemia: Secondary | ICD-10-CM | POA: Insufficient documentation

## 2023-02-15 DIAGNOSIS — R202 Paresthesia of skin: Secondary | ICD-10-CM

## 2023-02-15 DIAGNOSIS — Z7982 Long term (current) use of aspirin: Secondary | ICD-10-CM | POA: Diagnosis not present

## 2023-02-15 DIAGNOSIS — I10 Essential (primary) hypertension: Secondary | ICD-10-CM | POA: Diagnosis not present

## 2023-02-15 DIAGNOSIS — H538 Other visual disturbances: Secondary | ICD-10-CM | POA: Insufficient documentation

## 2023-02-15 DIAGNOSIS — Z8673 Personal history of transient ischemic attack (TIA), and cerebral infarction without residual deficits: Secondary | ICD-10-CM | POA: Diagnosis not present

## 2023-02-15 LAB — DIFFERENTIAL
Abs Immature Granulocytes: 0.01 10*3/uL (ref 0.00–0.07)
Basophils Absolute: 0 10*3/uL (ref 0.0–0.1)
Basophils Relative: 1 %
Eosinophils Absolute: 0.2 10*3/uL (ref 0.0–0.5)
Eosinophils Relative: 3 %
Immature Granulocytes: 0 %
Lymphocytes Relative: 38 %
Lymphs Abs: 2.2 10*3/uL (ref 0.7–4.0)
Monocytes Absolute: 0.8 10*3/uL (ref 0.1–1.0)
Monocytes Relative: 14 %
Neutro Abs: 2.6 10*3/uL (ref 1.7–7.7)
Neutrophils Relative %: 44 %

## 2023-02-15 LAB — COMPREHENSIVE METABOLIC PANEL
ALT: 19 U/L (ref 0–44)
AST: 25 U/L (ref 15–41)
Albumin: 3.9 g/dL (ref 3.5–5.0)
Alkaline Phosphatase: 90 U/L (ref 38–126)
Anion gap: 11 (ref 5–15)
BUN: 15 mg/dL (ref 8–23)
CO2: 22 mmol/L (ref 22–32)
Calcium: 9 mg/dL (ref 8.9–10.3)
Chloride: 98 mmol/L (ref 98–111)
Creatinine, Ser: 1.3 mg/dL — ABNORMAL HIGH (ref 0.44–1.00)
GFR, Estimated: 42 mL/min — ABNORMAL LOW (ref >60)
Glucose, Bld: 108 mg/dL — ABNORMAL HIGH (ref 70–99)
Potassium: 4.7 mmol/L (ref 3.5–5.1)
Sodium: 131 mmol/L — ABNORMAL LOW (ref 135–145)
Total Bilirubin: 0.8 mg/dL (ref 0.3–1.2)
Total Protein: 7.2 g/dL (ref 6.5–8.1)

## 2023-02-15 LAB — CBC
HCT: 32.4 % — ABNORMAL LOW (ref 36.0–46.0)
Hemoglobin: 10.6 g/dL — ABNORMAL LOW (ref 12.0–15.0)
MCH: 30.8 pg (ref 26.0–34.0)
MCHC: 32.7 g/dL (ref 30.0–36.0)
MCV: 94.2 fL (ref 80.0–100.0)
Platelets: 278 10*3/uL (ref 150–400)
RBC: 3.44 MIL/uL — ABNORMAL LOW (ref 3.87–5.11)
RDW: 13.8 % (ref 11.5–15.5)
WBC: 5.9 10*3/uL (ref 4.0–10.5)
nRBC: 0 % (ref 0.0–0.2)

## 2023-02-15 LAB — I-STAT CHEM 8, ED
BUN: 17 mg/dL (ref 8–23)
Calcium, Ion: 1.05 mmol/L — ABNORMAL LOW (ref 1.15–1.40)
Chloride: 99 mmol/L (ref 98–111)
Creatinine, Ser: 1.4 mg/dL — ABNORMAL HIGH (ref 0.44–1.00)
Glucose, Bld: 110 mg/dL — ABNORMAL HIGH (ref 70–99)
HCT: 33 % — ABNORMAL LOW (ref 36.0–46.0)
Hemoglobin: 11.2 g/dL — ABNORMAL LOW (ref 12.0–15.0)
Potassium: 4.7 mmol/L (ref 3.5–5.1)
Sodium: 132 mmol/L — ABNORMAL LOW (ref 135–145)
TCO2: 21 mmol/L — ABNORMAL LOW (ref 22–32)

## 2023-02-15 LAB — PROTIME-INR
INR: 1.1 (ref 0.8–1.2)
Prothrombin Time: 14.3 s (ref 11.4–15.2)

## 2023-02-15 LAB — ETHANOL: Alcohol, Ethyl (B): <10 mg/dL (ref <10)

## 2023-02-15 LAB — APTT: aPTT: 31 s (ref 24–36)

## 2023-02-15 NOTE — ED Provider Triage Note (Signed)
Emergency Medicine Provider Triage Evaluation Note  Alicia Sherman , a 82 y.o. female  was evaluated in triage.  Pt complains of left vision loss, left-sided facial numbness.  Patient states that symptoms began this past Saturday with "not feeling well."  Patient states that she has been taking her blood pressure repeatedly since not feeling well and started to be elevated in the 170s to 190s systolic despite adherence with her hypertension medications.  Reports 3:00 yesterday afternoon having episode where she completely lost vision in her left eye but could only see "waves" in her vision.  States that symptoms of visual loss resolved and has some residual left-sided facial "funny feeling".  Patient states that she also is having some left leg numbness as well as left arm numbness that has been present since the weekend.  Review of Systems  Positive: See above Negative:   Physical Exam  BP (!) 176/96 (BP Location: Right Arm)   Pulse 83   Temp 98.6 F (37 C) (Oral)   Resp 18   Ht 5\' 3"  (1.6 m)   Wt 57.6 kg   SpO2 100%   BMI 22.49 kg/m  Gen:   Awake, no distress   Resp:  Normal effort  MSK:   Moves extremities without difficulty  Other:    Medical Decision Making  Medically screening exam initiated at 6:21 PM.  Appropriate orders placed.  Cintya Daughety was informed that the remainder of the evaluation will be completed by another provider, this initial triage assessment does not replace that evaluation, and the importance of remaining in the ED until their evaluation is complete.     Peter Garter, [place] 02/15/23 [phone]

## 2023-02-15 NOTE — ED Triage Notes (Signed)
Pt c/o blurred vision of left eye, left leg and foot numbness started yesterday around 1500. Pt c/o HTN Saturday night.

## 2023-02-15 NOTE — Telephone Encounter (Signed)
The patient sent a mychart message regarding elevated blood pressure and visual changes. I called the patient back and sent her a mychart message. She did not initially answer but returned my call shortly after. I had advised her previously via voicemail and mychart message to go to the emergency room.  Upon taking the call the patient described to me that she had elevated blood pressure readings this weekend (yesterday her blood pressure was 196/102). She took an additional half of carvedilol and her blood pressure fell to around 123/80.  She also described visual changes, such as lines in her vision. It had since dissipated but she was now having a numb feeling in her eye and a "different" feeling. Her blood pressure continued to fluctuate today. She says she is having frequent arm and leg numbness and pain in her jaw.   I advised the patient once again to go to the Emergency Room given her current symptoms and history of TIA and stroke. She verbalized understanding and stated that she would go to the ER.

## 2023-02-16 ENCOUNTER — Emergency Department (HOSPITAL_COMMUNITY): Payer: Medicare Other

## 2023-02-16 DIAGNOSIS — I1A0 Resistant hypertension: Secondary | ICD-10-CM | POA: Diagnosis not present

## 2023-02-16 LAB — RAPID URINE DRUG SCREEN, HOSP PERFORMED
Amphetamines: NOT DETECTED
Barbiturates: NOT DETECTED
Benzodiazepines: NOT DETECTED
Cocaine: NOT DETECTED
Opiates: NOT DETECTED
Tetrahydrocannabinol: NOT DETECTED

## 2023-02-16 LAB — URINALYSIS, ROUTINE W REFLEX MICROSCOPIC
Bilirubin Urine: NEGATIVE
Glucose, UA: NEGATIVE mg/dL
Ketones, ur: NEGATIVE mg/dL
Leukocytes,Ua: NEGATIVE
Nitrite: NEGATIVE
Protein, ur: NEGATIVE mg/dL
Specific Gravity, Urine: 1.004 — ABNORMAL LOW (ref 1.005–1.030)
pH: 7 (ref 5.0–8.0)

## 2023-02-16 MED ORDER — IRBESARTAN 300 MG PO TABS
150.0000 mg | ORAL_TABLET | Freq: Every day | ORAL | Status: DC
  Administered 2023-02-16: 150 mg via ORAL
  Filled 2023-02-16: qty 1

## 2023-02-16 MED ORDER — CARVEDILOL 3.125 MG PO TABS
6.2500 mg | ORAL_TABLET | Freq: Two times a day (BID) | ORAL | Status: DC
  Administered 2023-02-16: 6.25 mg via ORAL
  Filled 2023-02-16: qty 2

## 2023-02-16 NOTE — Discharge Instructions (Signed)
MRI today looked okay.  Unclear what is causing the symptoms.  Continue your blood pressure medication at this time and call your neurologist for follow-up

## 2023-02-16 NOTE — ED Provider Notes (Signed)
Patient arrives negative for any acute changes.  This was discussed with the patient and her family member who was present at bedside.  She reports the numbness in the leg is starting to improve finally, her vision is normal but 3 days ago she did lose vision for a period of time.  She has been worked up for this in the past and they are saying it is TIAs.  Did question if she had headaches associated with this as she does see wavy lines and wondering if this could possibly be an aura atypical migraine complex.  At this time feel that patient is stable for discharge home.  She will follow-up with her neurologist outpatient.   Gwyneth Sprout, MD 02/16/23 1044

## 2023-02-16 NOTE — ED Provider Notes (Signed)
[facility] EMERGENCY DEPARTMENT AT [facility] Provider Note   CSN: [id number] Arrival date & time: 02/15/23  1800     History  Chief Complaint  Patient presents with   Numbness    Alicia Sherman is a 82 y.o. female.  HPI     This is a 82 year old female who presents with concern for paresthesias of the face and numbness of the left side.  Patient reports that she began to not feel well on Saturday.  She states that she had interval decreased vision in the left eye with stripes that came down.  This has happened to her twice before.  However this time it stayed longer.  It did ultimately self resolved.  She began to have some abnormal sensation on the left side of her face, left leg, and left foot.  She has not noted any weakness.  She has noted increased blood pressure at home.  No speech difficulty.  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  Home Medications Prior to Admission medications   Medication Sig Start Date End Date Taking? Authorizing Provider  ASPIRIN 81 PO Take 81 mg by mouth daily.   Yes [provider]  atorvastatin (LIPITOR) 40 MG tablet Take 40 mg by mouth at bedtime. 06/02/22  Yes [provider]  Calcium Carb-Cholecalciferol 600-12.5 MG-MCG CAPS Take 1 capsule by mouth 2 (two) times daily.   Yes [provider]  carvedilol (COREG) 6.25 MG tablet Take 6.25 mg by mouth 2 (two) times daily with a meal. 06/02/22 02/16/23 Yes [provider]  Probiotic Product (ADVANCED PROBIOTIC 10) CAPS Take 1 capsule by mouth daily.   Yes [provider]  valsartan (DIOVAN) 160 MG tablet Take 160 mg by mouth daily.   Yes [provider]  Vitamin D, Ergocalciferol, (DRISDOL) 1.25 MG (50000 UNIT) CAPS capsule Take 50,000 Units by mouth every 30 (thirty) days. 02/03/23  Yes [provider]      Allergies    Biaxin [clarithromycin] and Codeine    Review of Systems   Review of Systems   Constitutional:  Negative for fever.  Eyes:  Positive for visual disturbance.  Neurological:  Positive for numbness. Negative for weakness and headaches.  All other systems reviewed and are negative.   Physical Exam Updated Vital Signs BP (!) 162/94 (BP Location: Right Arm)   Pulse 70   Temp 97.9 F (36.6 C) (Oral)   Resp 16   Ht 1.6 m (5\' 3" )   Wt 57.6 kg   SpO2 100%   BMI 22.49 kg/m  Physical Exam Vitals and nursing note reviewed.  Constitutional:      Appearance: She is well-developed. She is not ill-appearing.  HENT:     Head: Normocephalic and atraumatic.  Eyes:     Pupils: Pupils are equal, round, and reactive to light.  Cardiovascular:     Rate and Rhythm: Normal rate and regular rhythm.     Heart sounds: Normal heart sounds.  Pulmonary:     Effort: Pulmonary effort is normal. No respiratory distress.     Breath sounds: No wheezing.  Abdominal:     Palpations: Abdomen is soft.     Tenderness: There is no abdominal tenderness.  Musculoskeletal:     Cervical back: Neck supple.  Skin:    General: Skin is warm and dry.  Neurological:     Mental Status: She is alert and oriented to person, place, and time.  Comments: Cranial nerves II through XII intact, 5 out of 5 strength in all 4 extremities, no dysmetria to finger-nose-finger  Psychiatric:        Mood and Affect: Mood normal.     ED Results / Procedures / Treatments   Labs (all labs ordered are listed, but only abnormal results are displayed) Labs Reviewed  CBC - Abnormal; Notable for the following components:      Result Value   RBC 3.44 (*)    Hemoglobin 10.6 (*)    HCT 32.4 (*)    All other components within normal limits  COMPREHENSIVE METABOLIC PANEL - Abnormal; Notable for the following components:   Sodium 131 (*)    Glucose, Bld 108 (*)    Creatinine, Ser 1.30 (*)    GFR, Estimated 42 (*)    All other components within normal limits  URINALYSIS, ROUTINE W REFLEX MICROSCOPIC - Abnormal;  Notable for the following components:   Color, Urine STRAW (*)    Specific Gravity, Urine 1.004 (*)    Hgb urine dipstick SMALL (*)    Bacteria, UA RARE (*)    All other components within normal limits  I-STAT CHEM 8, ED - Abnormal; Notable for the following components:   Sodium 132 (*)    Creatinine, Ser 1.40 (*)    Glucose, Bld 110 (*)    Calcium, Ion 1.05 (*)    TCO2 21 (*)    Hemoglobin 11.2 (*)    HCT 33.0 (*)    All other components within normal limits  ETHANOL  PROTIME-INR  APTT  DIFFERENTIAL  RAPID URINE DRUG SCREEN, HOSP PERFORMED    EKG EKG Interpretation Date/Time:  Monday February 15 2023 18:09:08 EDT Ventricular Rate:  86 PR Interval:  176 QRS Duration:  84 QT Interval:  362 QTC Calculation: 433 R Axis:   84  Text Interpretation: Normal sinus rhythm Nonspecific ST abnormality No significant change since last tracing When compared with ECG of 18-May-2019 15:12, PREVIOUS ECG IS PRESENT Confirmed by Gwyneth Sprout ([id number]) on 02/16/2023 9:27:15 AM  Radiology MR BRAIN WO CONTRAST  Result Date: 02/16/2023 CLINICAL DATA:  Left-sided visual loss and left-sided facial numbness. EXAM: MRI HEAD WITHOUT CONTRAST TECHNIQUE: Multiplanar, multiecho pulse sequences of the brain and surrounding structures were obtained without intravenous contrast. COMPARISON:  Head CT 02/15/2023. Prior brain MRI is not available for review. FINDINGS: Brain: No acute infarction, hemorrhage, hydrocephalus, extra-axial collection or mass lesion. Chronic small vessel ischemia in the cerebral white matter that is mild. Brain volume is normal. Vascular: Major flow voids are preserved Skull and upper cervical spine: Normal marrow signal. Sinuses/Orbits: No orbital finding to explain symptoms. Fluid layers in the left sphenoid sinus, incidental by history. Subcentimeter T1 hyperintense structure with fluid fluid level in the upper left parotid attributed to non worrisome cyst. IMPRESSION: No acute  finding including infarct.  No specific cause for symptoms. Electronically Signed   By: Tiburcio Pea M.D.   On: 02/16/2023 09:51   CT HEAD WO CONTRAST  Result Date: 02/15/2023 CLINICAL DATA:  Left-sided facial numbness and visual loss, initial encounter EXAM: CT HEAD WITHOUT CONTRAST TECHNIQUE: Contiguous axial images were obtained from the base of the skull through the vertex without intravenous contrast. RADIATION DOSE REDUCTION: This exam was performed according to the departmental dose-optimization program which includes automated exposure control, adjustment of the mA and/or kV according to patient size and/or use of iterative reconstruction technique. COMPARISON:  05/31/2022 FINDINGS: Brain: No evidence of acute infarction, hemorrhage,  hydrocephalus, extra-axial collection or mass lesion/mass effect. Vascular: No hyperdense vessel or unexpected calcification. Skull: Normal. Negative for fracture or focal lesion. Sinuses/Orbits: Mild mucosal changes are noted in the sphenoid sinus. Other: None. IMPRESSION: Mild mucosal changes in the sphenoid sinus. No acute intracranial abnormality noted. Electronically Signed   By: Alcide Clever M.D.   On: 02/15/2023 23:21    Procedures Procedures    Medications Ordered in ED Medications - No data to display  ED Course/ Medical Decision Making/ A&P                                 Medical Decision Making Amount and/or Complexity of Data Reviewed Radiology: ordered.   This patient presents to the ED for concern of paresthesias left face and left body, hypertension, this involves an extensive number of treatment options, and is a complaint that carries with it a high risk of complications and morbidity.  I considered the following differential and admission for this acute, potentially life threatening condition.  The differential diagnosis includes TIA, stroke, hypertensive urgency, hypertensive emergency  MDM:    This is an 82 year old female who  presents with left-sided facial numbness and left body numbness and tingling.  Also concern for hypertension.  She is nontoxic.  Vital signs notable for blood pressure 162/94.  On physical exam she has no obvious focal deficits.  Labs reviewed from triage and largely reassuring.  CT head is negative.  Patient reports previously having full stroke workup in January.  She has followed up with Dr. Pearlean Brownie as an outpatient.  She is currently on aspirin.  Will obtain MRI to rule out stroke.  (Labs, imaging, consults)  Labs: I Ordered, and personally interpreted labs.  The pertinent results include: Stroke lab work  Imaging Studies ordered: I ordered imaging studies including CT, MRI pending I independently visualized and interpreted imaging. I agree with the radiologist interpretation  Additional history obtained from daughter at bedside.  External records from outside source obtained and reviewed including prior evaluations and neurology notes  Cardiac Monitoring: The patient was not maintained on a cardiac monitor.  If on the cardiac monitor, I personally viewed and interpreted the cardiac monitored which showed an underlying rhythm of: N/A  Reevaluation: After the interventions noted above, I reevaluated the patient and found that they have :stayed the same  Social Determinants of Health:  lives independently  Disposition: Pending MRI  Co morbidities that complicate the patient evaluation  Past Medical History:  Diagnosis Date   GERD (gastroesophageal reflux disease)    Hypertension    Osteoporosis      Medicines Meds ordered this encounter  Medications   DISCONTD: carvedilol (COREG) tablet 6.25 mg   DISCONTD: irbesartan (AVAPRO) tablet 150 mg    I have reviewed the patients home medicines and have made adjustments as needed  Problem List / ED Course: Problem List Items Addressed This Visit   None Visit Diagnoses     Paresthesia    -  Primary   Resistant hypertension        Relevant Medications   valsartan (DIOVAN) 160 MG tablet                   Final Clinical Impression(s) / ED Diagnoses Final diagnoses:  Paresthesia  Resistant hypertension    Rx / DC Orders ED Discharge Orders     None  Shon Baton, MD 02/16/23 [id number]

## 2023-02-18 ENCOUNTER — Other Ambulatory Visit: Payer: Self-pay | Admitting: Neurology

## 2023-02-18 ENCOUNTER — Encounter: Payer: Self-pay | Admitting: Neurology

## 2023-02-18 ENCOUNTER — Ambulatory Visit (INDEPENDENT_AMBULATORY_CARE_PROVIDER_SITE_OTHER): Payer: Medicare Other | Admitting: Neurology

## 2023-02-18 VITALS — BP 139/81 | HR 65 | Ht 63.0 in | Wt 125.0 lb

## 2023-02-18 DIAGNOSIS — H539 Unspecified visual disturbance: Secondary | ICD-10-CM

## 2023-02-18 DIAGNOSIS — E7849 Other hyperlipidemia: Secondary | ICD-10-CM | POA: Diagnosis not present

## 2023-02-18 DIAGNOSIS — G43009 Migraine without aura, not intractable, without status migrainosus: Secondary | ICD-10-CM

## 2023-02-18 NOTE — Patient Instructions (Signed)
I had a long discussion with the patient regarding Alicia Sherman recent episode of transient vision disturbance and left sided body paresthesias likely representing Alicia Sherman typical migraine episode particularly since she has had some previous episodes of vision disturbance in the room much milder in the past.  Reviewed results of imaging studies and answered questions.  Since episodes are quite infrequent at the present time I do not believe migraine prophylaxis is indicated.  Advised Alicia Sherman to pay attention to possible triggers and to avoid them.  Continue aspirin for stroke prevention and maintain aggressive risk factor modification with strict control of hypertension with blood pressure goal below 130/90, lipids with LDL cholesterol goal below 70 mg percent and diabetes with hemoglobin A1c goal below 6.5%.  She will return for follow-up in the future in a year or call earlier if necessary.

## 2023-02-18 NOTE — Progress Notes (Signed)
[facility] [address] [place] [place] [address] [phone]       OFFICE follow-up visit T NOTE  Alicia Sherman Date of Birth:  1940/10/28 Medical Record Number:  [id number]   Referring MD:  Dina Rich  Reason for Referral:  TIA  HPI: Initial visit 06/06/2022 [name] is a pleasant 82 year old Caucasian lady seen today for initial office consultation visit.  She is accompanied today by her niece Sammuel Cooper, NP.  History is obtained from them and review of electronic medical records and care everywhere.  No actual imaging films are available for review but reports were available.  He has past medical history of hypertension, osteoporosis and gastroesophageal reflux disease.  He was recently admitted to [facility] on 05/31/2022 for sudden onset of left face and and feet paresthesias which she describes as a sensation of tingling and lasted for several hours and improved on this the next day.  She denies any accompanying slurred speech, extremity weakness, gait or balance problems. Her blood pressure was found to be elevated with systolics in the 190s.  By the time EMS arrived with came down somewhat to 160s.  He was taken to the hospital where she was hospitalized.  CT head was unremarkable.  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  and changes of chronic small vessel disease.  CT angiogram of the brain and neck showed no large vessel stenosis but showed only minor abnormalities.  Transthoracic echo showed ejection fraction of 60 to 65%.  Left atrial size is normal.  LDL cholesterol was 97 mg percent and hemoglobin A1c 5.8.  Serum sodium was 124 but she has known chronic hyponatremia.  Patient states she had somewhat similar episode in September 2023 and she was seen briefly in [facility] emergency room at that time.  He was told that her blood pressure was elevated and that was responsible for her symptoms.  Since  discharge patient states about a week or 2 ago she also had a milder episode of tingling on the left side.  She has been started on aspirin 81 mg daily which she is taking and tolerating well without side effects.  Her blood pressure is doing better though today it is slightly elevated in office at 153/83. Update 09/15/2022 : She returns for follow-up after last visit 3 months ago.  She is doing well.  She has had no further episodes of paresthesias or any other stroke or TIA-like symptoms.  He no longer notes any dizziness or any sensation in the left leg.  She remains on aspirin which is tolerating well without bruising or bleeding.  She states her blood pressure is well-controlled at home and she brought her blood pressure log which shows average systolic ranging from 110-130s.  Today her blood pressure is slightly elevated in the office at 160/80.  She is tolerating Lipitor 40 mg well without muscle aches and pains.  Last lipid profile on 08/31/2022 showed total cholesterol 121, LDL 53, HDL 55 and triglycerides 54 mg percent.  Hemoglobin A1c on 07/02/2022 was optimal at 5.8.  He continues to have mild short-term memory difficulties but she feels these are stable and nonprogressive.  He is still quite independent with all activities of daily living.  He has however not been doing regular activities challenging.  He is still quite physically active.  She has no new complaints. Update 02/18/2023 ; she is worked into the schedule today upon request as  she had a recent episode.  She was seen in the ER on 02/15/2023 with episode which began the day before.  She had gone to her grand nephews birthday celebration and felt tired.  The evening she was not feeling her usual self and her blood pressure was elevated 196/102.  She took an extra dose of carvedilol and went to sleep. Morning she still felt tired and exhausted and blood pressure continued to fluctuate and be high.  In the afternoon she noticed wavy vision in the  left eye this lasted about 15 to 20 minutes.  Subsequently she noticed numbness in the left foot which spread to involve the left hand and to a lesser extent the left cheek as well.  She called office and was asked to go to the hospital for evaluation as she had had a few previous episodes of wavy vision within the last 1 year but they have not lasted as long.  CT of the head was unremarkable and MRI scan of the brain showed no acute abnormality.  Patient's symptoms resolved and she has had no further recurrence since then.  She does have a history of migraine headaches early in life but over the years the headaches have become quite infrequent.  She denied any headache accompanying before during or after disc present episode.  She remains on aspirin which is tolerating well without bruising or bleeding and Lipitor 40 mg daily which also she is tolerating well.  Her blood pressure fluctuates but is usually under good control. ROS:   14 system review of systems is positive for tingling, numbness and all other systems negative  PMH:  Past Medical History:  Diagnosis Date   GERD (gastroesophageal reflux disease)    Hypertension    Osteoporosis     Social History:  Social History   Socioeconomic History   Marital status: Widowed    Spouse name: Not on file   Number of children: Not on file   Years of education: Not on file   Highest education level: Not on file  Occupational History   Not on file  Tobacco Use   Smoking status: Never   Smokeless tobacco: Never  Vaping Use   Vaping status: Never Used  Substance and Sexual Activity   Alcohol use: Never   Drug use: Never   Sexual activity: Not on file  Other Topics Concern   Not on file  Social History Narrative   Not on file   Social Determinants of Health   Financial Resource Strain: Not on file  Food Insecurity: Low Risk  (11/16/2022)   Received from [facility]   Hunger Vital Sign    Worried About Running Out of Food in the  Last Year: Never true    Ran Out of Food in the Last Year: Never true  Transportation Needs: Not on file (11/16/2022)  Physical Activity: Not on file  Stress: Not on file  Social Connections: Not on file  Intimate Partner Violence: Low Risk  (05/31/2022)   Received from [facility] visits prior to 07/18/2022., [facility] visits prior to 07/18/2022.   Safety    How often does anyone, including family and friends, physically hurt you?: Never    How often does anyone, including family and friends, insult or talk down to you?: Never    How often does anyone, including family and friends, threaten you with harm?: Never    How often does anyone, including family and  friends, scream or curse at you?: Never    Medications:   Current Outpatient Medications on File Prior to Visit  Medication Sig Dispense Refill   ASPIRIN 81 PO Take 81 mg by mouth daily.     atorvastatin (LIPITOR) 40 MG tablet Take 40 mg by mouth at bedtime.     Calcium Carb-Cholecalciferol 600-12.5 MG-MCG CAPS Take 1 capsule by mouth 2 (two) times daily.     Probiotic Product (ADVANCED PROBIOTIC 10) CAPS Take 1 capsule by mouth daily.     valsartan (DIOVAN) 160 MG tablet Take 160 mg by mouth daily.     Vitamin D, Ergocalciferol, (DRISDOL) 1.25 MG (50000 UNIT) CAPS capsule Take 50,000 Units by mouth every 30 (thirty) days.     carvedilol (COREG) 6.25 MG tablet Take 6.25 mg by mouth 2 (two) times daily with a meal.     No current facility-administered medications on file prior to visit.    Allergies:   Allergies  Allergen Reactions   Biaxin [Clarithromycin] Nausea And Vomiting   Codeine Nausea And Vomiting    Physical Exam General: well developed, well nourished pleasant elderly Caucasian lady, seated, in no evident distress Head: head normocephalic and atraumatic.   Neck: supple with no carotid or supraclavicular bruits Cardiovascular: regular rate and rhythm, no  murmurs Musculoskeletal: no deformity Skin:  no rash/petichiae Vascular:  Normal pulses all extremities  Neurologic Exam Mental Status: Awake and fully alert. Oriented to place and time. Recent and remote memory intact. Attention span, concentration and fund of knowledge appropriate. Mood and affect appropriate.  Recall 3/3.  Cranial Nerves: Fundoscopic exam not done. Pupils equal, briskly reactive to light. Extraocular movements full without nystagmus. Visual fields full to confrontation. Hearing intact. Facial sensation intact. Face, tongue, palate moves normally and symmetrically.  Motor: Normal bulk and tone. Normal strength in all tested extremity muscles. Sensory.: intact to touch , pinprick , position and vibratory sensation.  Coordination: Rapid alternating movements normal in all extremities. Finger-to-nose and heel-to-shin performed accurately bilaterally. Gait and Station: Arises from chair without difficulty. Stance is normal. Gait demonstrates normal stride length and balance . Able to heel, toe and tandem walk with moderate difficulty.  Reflexes: 1+ and symmetric. Toes downgoing.      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  Vascular risk factors of hypertension and hyperlipidemia and age only.  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  Recent episode of transient vision disturbance and left leg and paresthesias in September 2024 likely a complicated migraine episode.     PLAN: I had a long discussion with the patient regarding her recent episode of transient vision disturbance and left sided body paresthesias likely representing her typical migraine episode particularly since she has had some previous episodes of vision disturbance in the room much milder in the past.  Reviewed results of imaging  studies and answered questions.  Since episodes are quite infrequent at the present time I do not believe migraine prophylaxis is indicated.  Advised her to pay attention to possible triggers and to avoid them.  Continue aspirin for stroke prevention and maintain aggressive risk factor modification with strict control of hypertension with blood pressure goal below 130/90, lipids with LDL cholesterol goal below 70 mg percent and diabetes with hemoglobin A1c goal below 6.5%.  She will return for follow-up in the future in a year or call earlier  if necessary.  Greater than 50% time during this 35-minute   visit were spent on counseling and coordination of care about episodes of paresthesias and TIA and answering questions.  Delia Heady, MD Note: This document was prepared with digital dictation and possible smart phrase technology. Any transcriptional errors that result from this process are unintentional.

## 2023-02-19 LAB — LIPID PANEL
Chol/HDL Ratio: 2.2 {ratio} (ref 0.0–4.4)
Cholesterol, Total: 125 mg/dL (ref 100–199)
HDL: 57 mg/dL (ref >39)
LDL Chol Calc (NIH): 55 mg/dL (ref 0–99)
Triglycerides: 61 mg/dL (ref 0–149)
VLDL Cholesterol Cal: 13 mg/dL (ref 5–40)

## 2023-02-19 LAB — HEMOGLOBIN A1C
Est. average glucose Bld gHb Est-mCnc: 123 mg/dL
Hgb A1c MFr Bld: 5.9 % — ABNORMAL HIGH (ref 4.8–5.6)

## 2023-09-15 ENCOUNTER — Ambulatory Visit (INDEPENDENT_AMBULATORY_CARE_PROVIDER_SITE_OTHER): Payer: Medicare Other | Admitting: Neurology

## 2023-09-15 ENCOUNTER — Encounter: Payer: Self-pay | Admitting: Neurology

## 2023-09-15 VITALS — BP 169/78 | HR 71 | Ht 63.0 in | Wt 128.0 lb

## 2023-09-15 DIAGNOSIS — Z8673 Personal history of transient ischemic attack (TIA), and cerebral infarction without residual deficits: Secondary | ICD-10-CM

## 2023-09-15 DIAGNOSIS — G3184 Mild cognitive impairment, so stated: Secondary | ICD-10-CM

## 2023-09-15 DIAGNOSIS — G43009 Migraine without aura, not intractable, without status migrainosus: Secondary | ICD-10-CM | POA: Diagnosis not present

## 2023-09-15 NOTE — Progress Notes (Signed)
 [facility] [address] [place]. [place] [address] [phone]       OFFICE follow-up visit T NOTE  [name] Date of Birth:  03/07/1941 Medical Record Number:  [id number]   Referring MD:  Alicia Sherman  Reason for Referral:  TIA  HPI: Initial visit 06/06/2022 Alicia Sherman is a pleasant 83 year old Caucasian lady seen today for initial office consultation visit.  She is accompanied today by her niece Alicia Fear, NP.  History is obtained from them and review of electronic medical records and care everywhere.  No actual imaging films are available for review but reports were available.  He has past medical history of hypertension, osteoporosis and gastroesophageal reflux disease.  He was recently admitted to [facility] on 05/31/2022 for sudden onset of left face and and feet paresthesias which she describes as a sensation of tingling and lasted for several hours and improved on this the next day.  She denies any accompanying slurred speech, extremity weakness, gait or balance problems. Her blood pressure was found to be elevated with systolics in the 190s.  By the time EMS arrived with came down somewhat to 160s.  He was taken to the hospital where she was hospitalized.  CT head was unremarkable.  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  and changes of chronic small vessel disease.  CT angiogram of the brain and neck showed no large vessel stenosis but showed only minor abnormalities.  Transthoracic echo showed ejection fraction of 60 to 65%.  Left atrial size is normal.  LDL cholesterol was 97 mg percent and hemoglobin A1c 5.8.  Serum sodium was 124 but she has known chronic hyponatremia.  Patient states she had somewhat similar episode in September 2023 and she was seen briefly in [facility] emergency room at that time.  He was told that her blood pressure was elevated and that was responsible for her symptoms.  Since  discharge patient states about a week or 2 ago she also had a milder episode of tingling on the left side.  She has been started on aspirin 81 mg daily which she is taking and tolerating well without side effects.  Her blood pressure is doing better though today it is slightly elevated in office at 153/83. Update 09/15/2022 : She returns for follow-up after last visit 3 months ago.  She is doing well.  She has had no further episodes of paresthesias or any other stroke or TIA-like symptoms.  He no longer notes any dizziness or any sensation in the left leg.  She remains on aspirin which is tolerating well without bruising or bleeding.  She states her blood pressure is well-controlled at home and she brought her blood pressure log which shows average systolic ranging from 110-130s.  Today her blood pressure is slightly elevated in the office at 160/80.  She is tolerating Lipitor 40 mg well without muscle aches and pains.  Last lipid profile on 08/31/2022 showed total cholesterol 121, LDL 53, HDL 55 and triglycerides 54 mg percent.  Hemoglobin A1c on 07/02/2022 was optimal at 5.8.  He continues to have mild short-term memory difficulties but she feels these are stable and nonprogressive.  He is still quite independent with all activities of daily living.  He has however not been doing regular activities challenging.  He is still quite physically active.  She has no new complaints. Update 02/18/2023 ; she is worked into the schedule today upon request as  she had a recent episode.  She was seen in the ER on 02/15/2023 with episode which began the day before.  She had gone to her grand nephews birthday celebration and felt tired.  The evening she was not feeling her usual self and her blood pressure was elevated 196/102.  She took an extra dose of carvedilol  and went to sleep. Morning she still felt tired and exhausted and blood pressure continued to fluctuate and be high.  In the afternoon she noticed wavy vision in the  left eye this lasted about 15 to 20 minutes.  Subsequently she noticed numbness in the left foot which spread to involve the left hand and to a lesser extent the left cheek as well.  She called office and was asked to go to the hospital for evaluation as she had had a few previous episodes of wavy vision within the last 1 year but they have not lasted as long.  CT of the head was unremarkable and MRI scan of the brain showed no acute abnormality.  Patient's symptoms resolved and she has had no further recurrence since then.  She does have a history of migraine headaches early in life but over the years the headaches have become quite infrequent.  She denied any headache accompanying before during or after disc present episode.  She remains on aspirin which is tolerating well without bruising or bleeding and Lipitor 40 mg daily which also she is tolerating well.  Her blood pressure fluctuates but is usually under good control. Update 09/15/2023 : She returns for follow-up after last visit 6 months ago.  She is accompanied by her niece.  She is doing well.  She has had no recurrent stroke or TIA symptoms.  She is also not had any episodes of vision disturbance either.  She remains on aspirin which she is tolerating well with minor bruising and no bleeding.  She is tolerating Lipitor well without muscle aches and pains.  She states her blood pressure is under good control.  She is living with her daughter but is mostly independent in all activities of daily living.  She is even driving.  She continues to have mild memory and cognitive difficulties but these are unchanged.  She occasionally has difficulty remembering names.  She had lab work in 07/05/2023 and LDL cholesterol was optimal at 60 mg percent.  She has no new complaints ROS:   14 system review of systems is positive for tingling, numbness and all other systems negative  PMH:  Past Medical History:  Diagnosis Date   GERD (gastroesophageal reflux disease)     Hypertension    Osteoporosis     Social History:  Social History   Socioeconomic History   Marital status: Widowed    Spouse name: Not on file   Number of children: Not on file   Years of education: Not on file   Highest education level: Not on file  Occupational History   Not on file  Tobacco Use   Smoking status: Never   Smokeless tobacco: Never  Vaping Use   Vaping status: Never Used  Substance and Sexual Activity   Alcohol use: Never   Drug use: Never   Sexual activity: Not on file  Other Topics Concern   Not on file  Social History Narrative   Not on file   Social Drivers of Health   Financial Resource Strain: Not on file  Food Insecurity: Low Risk  (07/05/2023)   Received from [facility]  Hunger Vital Sign    Worried About Running Out of Food in the Last Year: Never true    Ran Out of Food in the Last Year: Never true  Transportation Needs: No Transportation Needs (07/05/2023)   Received from [facility]    In the past 12 months, has lack of reliable transportation kept you from medical appointments, meetings, work or from getting things needed for daily living? : No  Physical Activity: Not on file  Stress: Not on file  Social Connections: Not on file  Intimate Partner Violence: Low Risk  (05/31/2022)   Received from [facility] visits prior to 07/18/2022., [facility] visits prior to 07/18/2022.   Safety    How often does anyone, including family and friends, physically hurt you?: Never    How often does anyone, including family and friends, insult or talk down to you?: Never    How often does anyone, including family and friends, threaten you with harm?: Never    How often does anyone, including family and friends, scream or curse at you?: Never    Medications:   Current Outpatient Medications on File Prior to Visit  Medication Sig Dispense Refill   ASPIRIN 81 PO Take 81 mg by mouth daily.      atorvastatin (LIPITOR) 40 MG tablet Take 40 mg by mouth at bedtime.     Calcium Carb-Cholecalciferol 600-12.5 MG-MCG CAPS Take 1 capsule by mouth 2 (two) times daily.     carvedilol  (COREG ) 6.25 MG tablet Take 6.25 mg by mouth 2 (two) times daily with a meal.     Probiotic Product (ADVANCED PROBIOTIC 10) CAPS Take 1 capsule by mouth daily.     valsartan (DIOVAN) 160 MG tablet Take 160 mg by mouth daily.     Vitamin D, Ergocalciferol, (DRISDOL) 1.25 MG (50000 UNIT) CAPS capsule Take 50,000 Units by mouth every 30 (thirty) days.     No current facility-administered medications on file prior to visit.    Allergies:   Allergies  Allergen Reactions   Biaxin [Clarithromycin] Nausea And Vomiting   Codeine Nausea And Vomiting    Physical Exam General: well developed, well nourished pleasant elderly Caucasian lady, seated, in no evident distress Head: head normocephalic and atraumatic.   Neck: supple with no carotid or supraclavicular bruits Cardiovascular: regular rate and rhythm, no murmurs Musculoskeletal: no deformity Skin:  no rash/petichiae Vascular:  Normal pulses all extremities  Neurologic Exam Mental Status: Awake and fully alert. Oriented to place and time. Recent and remote memory intact. Attention span, concentration and fund of knowledge appropriate. Mood and affect appropriate.  Recall 3/3.  Able to name 15 animals which can walk on  4 legs.  Clock drawing 4/4. Cranial Nerves: Fundoscopic exam not done. Pupils equal, briskly reactive to light. Extraocular movements full without nystagmus. Visual fields full to confrontation. Hearing intact. Facial sensation intact. Face, tongue, palate moves normally and symmetrically.  Motor: Normal bulk and tone. Normal strength in all tested extremity muscles. Sensory.: intact to touch , pinprick , position and vibratory sensation.  Coordination: Rapid alternating movements normal in all extremities. Finger-to-nose and heel-to-shin  performed accurately bilaterally. Gait and Station: Arises from chair without difficulty. Stance is normal. Gait demonstrates normal stride length and balance . Able to heel, toe and tandem walk with moderate difficulty.  Reflexes: 1+ and symmetric. Toes downgoing.      ASSESSMENT: 83 year old pleasant Caucasian lady with recurrent transient intermittent episodes of  cheio oral paresthesias on the left likely TIA from small vessel disease.  Vascular risk factors of hypertension and hyperlipidemia and age only.  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  Recent episode of transient vision disturbance and left leg and paresthesias in September 2024 likely a complicated migraine episode.     PLAN: I had a long discussion with the patient and her niece regarding her remote history of TIA,a typical migraine and mild cognitive impairment and answered questions.  Continue aspirin for stroke prevention and maintain aggressive risk factor modification with strict control of hypertension with blood pressure goal below 130/90, lipids with LDL cholesterol goal below 70 mg percent and diabetes with hemoglobin A1c goal below 6.5%.  Check screening carotid ultrasound study.  I advised her to increase participation in cognitively challenging activities like solving crossword puzzles, playing bridge and sudoku.  We also discussed memory compensation strategies.  She will return for follow-up in the future only as needed and no scheduled appointment was made  Greater than 50% time during this 35-minute   visit were spent on counseling and coordination of care about episodes of paresthesias and TIA and answering questions.  Ardella Beaver, MD Note: This document was prepared with digital dictation and possible smart phrase technology. Any transcriptional errors that result from this process are unintentional.

## 2023-09-15 NOTE — Patient Instructions (Signed)
 I had a long discussion with the patient and her niece regarding her remote history of TIA,a typical migraine and mild cognitive impairment and answered questions.  Continue aspirin for stroke prevention and maintain aggressive risk factor modification with strict control of hypertension with blood pressure goal below 130/90, lipids with LDL cholesterol goal below 70 mg percent and diabetes with hemoglobin A1c goal below 6.5%.  I advised her to increase participation in cognitively challenging activities like solving crossword puzzles, playing bridge and sudoku.  We also discussed memory compensation strategies.  She will return for follow-up in the future only as needed and no scheduled appointment was made  Memory Compensation Strategies  Use "WARM" strategy.  W= write it down  A= associate it  R= repeat it  M= make a mental note  2.   You can keep a Glass blower/designer.  Use a 3-ring notebook with sections for the following: calendar, important names and phone numbers,  medications, doctors' names/phone numbers, lists/reminders, and a section to journal what you did  each day.   3.    Use a calendar to write appointments down.  4.    Write yourself a schedule for the day.  This can be placed on the calendar or in a separate section of the Memory Notebook.  Keeping a  regular schedule can help memory.  5.    Use medication organizer with sections for each day or morning/evening pills.  You may need help loading it  6.    Keep a basket, or pegboard by the door.  Place items that you need to take out with you in the basket or on the pegboard.  You may also want to  include a message board for reminders.  7.    Use sticky notes.  Place sticky notes with reminders in a place where the task is performed.  For example: " turn off the  stove" placed by the stove, "lock the door" placed on the door at eye level, " take your medications" on  the bathroom mirror or by the place where you normally take  your medications.  8.    Use alarms/timers.  Use while cooking to remind yourself to check on food or as a reminder to take your medicine, or as a  reminder to make a call, or as a reminder to perform another task, etc.

## 2023-11-02 ENCOUNTER — Ambulatory Visit (HOSPITAL_COMMUNITY)
Admission: RE | Admit: 2023-11-02 | Discharge: 2023-11-02 | Disposition: A | Discharge status: Home / Self Care | Source: Ambulatory Visit | Attending: Neurology | Admitting: Neurology

## 2023-11-02 DIAGNOSIS — G459 Transient cerebral ischemic attack, unspecified: Secondary | ICD-10-CM

## 2023-11-02 DIAGNOSIS — Z8673 Personal history of transient ischemic attack (TIA), and cerebral infarction without residual deficits: Secondary | ICD-10-CM | POA: Insufficient documentation

## 2023-11-05 ENCOUNTER — Ambulatory Visit: Payer: Self-pay | Admitting: Neurology
